# Patient Record
Sex: Female | Born: 1970 | Hispanic: Yes | Marital: Single | Smoking: Never smoker
Health system: Southern US, Community
[De-identification: ages and names within clinical notes are randomized; demographics above are authoritative.]

---

## 2018-01-21 ENCOUNTER — Emergency Department (INDEPENDENT_AMBULATORY_CARE_PROVIDER_SITE_OTHER): Payer: Worker's Compensation

## 2018-01-21 ENCOUNTER — Emergency Department (INDEPENDENT_AMBULATORY_CARE_PROVIDER_SITE_OTHER)
Admission: EM | Admit: 2018-01-21 | Discharge: 2018-01-22 | Disposition: A | Discharge status: Home / Self Care | Payer: Worker's Compensation | Source: Home / Self Care | Attending: Emergency Medicine | Admitting: Emergency Medicine

## 2018-01-21 ENCOUNTER — Other Ambulatory Visit: Payer: Self-pay

## 2018-01-21 DIAGNOSIS — S99922A Unspecified injury of left foot, initial encounter: Secondary | ICD-10-CM | POA: Diagnosis not present

## 2018-01-21 DIAGNOSIS — W19XXXA Unspecified fall, initial encounter: Secondary | ICD-10-CM | POA: Diagnosis not present

## 2018-01-21 DIAGNOSIS — S82832A Other fracture of upper and lower end of left fibula, initial encounter for closed fracture: Secondary | ICD-10-CM

## 2018-01-21 DIAGNOSIS — M25572 Pain in left ankle and joints of left foot: Secondary | ICD-10-CM

## 2018-01-21 NOTE — ED Triage Notes (Signed)
10 panel urine drug screen per [facility] with [employer].  Amber stated that she does not want a blood alcohol.  Any questions call Amber cell-[phone], work [phone], [employer] main number [phone]

## 2018-01-21 NOTE — Discharge Instructions (Signed)
Take ibuprofen for pain. Wear your cast boot. Use your crutches to walk.  Follow-up at employee health next Monday.

## 2018-01-21 NOTE — ED Triage Notes (Signed)
This is a workers comp injury.  She works for [employer], and was walking from [employer] to the office and foot went off of side walk, and she fell and twisted her left ankle.  This happened around 5:30 pm.  She iced and took ibuprofen.  Ankle and foot swollen.

## 2018-01-21 NOTE — ED Provider Notes (Addendum)
[name]    CSN: [id number] Arrival date & time: 01/21/18  2024     History   Chief Complaint Chief Complaint  Patient presents with  . Foot Pain    HPI Gabriella Henderson is a 47 y.o. female.   HPI Patient was in her usual state of health until approximately 5:00 today when she was outside and walking indoors and stepped off of the curve suffering an inversion injury to the left ankle.  She does have a previous history of ligament injuries to the ankle many years ago.  She does not wear a brace.  She felt a pop in the left ankle and after that has not been able to bear weight. History reviewed. No pertinent past medical history.  There are no active problems to display for this patient.   History reviewed. No pertinent surgical history.  OB History   None      Home Medications    Prior to Admission medications   Not on File    Family History History reviewed. No pertinent family history.  Social History Social History   Tobacco Use  . Smoking status: Never Smoker  . Smokeless tobacco: Never Used  Substance Use Topics  . Alcohol use: Not Currently  . Drug use: Not Currently     Allergies   Patient has no known allergies.   Review of Systems Review of Systems  Musculoskeletal:       Patient having pain in her left ankle.  Unable to bear weight.  She is not having any discomfort in her hip or knee and did not fall to the ground.     Physical Exam Triage Vital Signs ED Triage Vitals  Enc Vitals Group     BP 01/21/18 2055 120/82     Pulse Rate 01/21/18 2055 78     Resp --      Temp 01/21/18 2055 97.9 F (36.6 C)     Temp Source 01/21/18 2055 Oral     SpO2 01/21/18 2055 99 %     Weight 01/21/18 2056 148 lb (67.1 kg)     Height 01/21/18 2056  (1.676 m)     Head Circumference --      Peak Flow --      Pain Score 01/21/18 2056 7     Pain Loc --      Pain Edu? --      Excl. in GC? --    No data found.  Updated Vital  Signs BP 120/82 (BP Location: Right Arm)   Pulse 78   Temp 97.9 F (36.6 C) (Oral)   Ht  (1.676 m)   Wt 148 lb (67.1 kg)   LMP 12/30/2017   SpO2 99%   BMI 23.89 kg/m   Visual Acuity Right Eye Distance:   Left Eye Distance:   Bilateral Distance:    Right Eye Near:   Left Eye Near:    Bilateral Near:     Physical Exam  Musculoskeletal:  There is significant swelling over the distal fibula.  There is pain with inversion and eversion.  There is a negative Homans sign.  There is normal capillary refill and dorsalis pedis pulses 2+.  There is no numbness to the toes.     UC Treatments / Results  Labs (all labs ordered are listed, but only abnormal results are displayed) Labs Reviewed - No data to display  EKG None  Radiology Dg Ankle Complete Left  Result Date:  01/21/2018 CLINICAL DATA:  Fall with ankle pain EXAM: LEFT ANKLE COMPLETE - 3+ VIEW COMPARISON:  None. FINDINGS: Possible small avulsion injury off the lateral fibular malleolar tip ankle mortise is symmetric. Minimal soft tissue swelling IMPRESSION: Possible small avulsion off the fibular malleolar tip. Electronically Signed   By: Jasmine Pang M.D.   On: 01/21/2018 21:15   Dg Foot Complete Left  Result Date: 01/21/2018 CLINICAL DATA:  Fall with injury to the foot and ankle EXAM: LEFT FOOT - COMPLETE 3+ VIEW COMPARISON:  None. FINDINGS: No fracture or malalignment.  Joint spaces are maintained. IMPRESSION: No acute osseous abnormality Electronically Signed   By: Jasmine Pang M.D.   On: 01/21/2018 21:14    Procedures Procedures (including critical care time)  Medications Ordered in UC Medications - No data to display  Initial Impression / Assessment and Plan / UC Course  I have reviewed the triage vital signs and the nursing notes.  Pertinent labs & imaging results that were available during my care of the patient were reviewed by me and considered in my medical decision making (see chart for details).       There appears to be a small a avulsion to the distal fibula.  She will be placed in a cast boot and given crutches.  She will be taken out of work and was advised to be seen at employee health on Monday. Final Clinical Impressions(s) / UC Diagnoses   Final diagnoses:  Other closed fracture of distal end of left fibula, initial encounter     Discharge Instructions     Take ibuprofen for pain. Wear your cast boot. Use your crutches to walk.  Follow-up at employee health next Monday.    ED Prescriptions    None     Controlled Substance Prescriptions  Controlled Substance Registry consulted? Not Applicable   Collene Gobble, MD 01/21/18 2133    Collene Gobble, MD 01/21/18 2153

## 2018-08-30 IMAGING — DX DG ANKLE COMPLETE 3+V*L*
3 series · 3 of 3 positions shown · non-contrast
Comparison: None.

CLINICAL DATA: Fall with ankle pain

EXAM:
LEFT ANKLE COMPLETE - 3+ VIEW

[ankle ap]
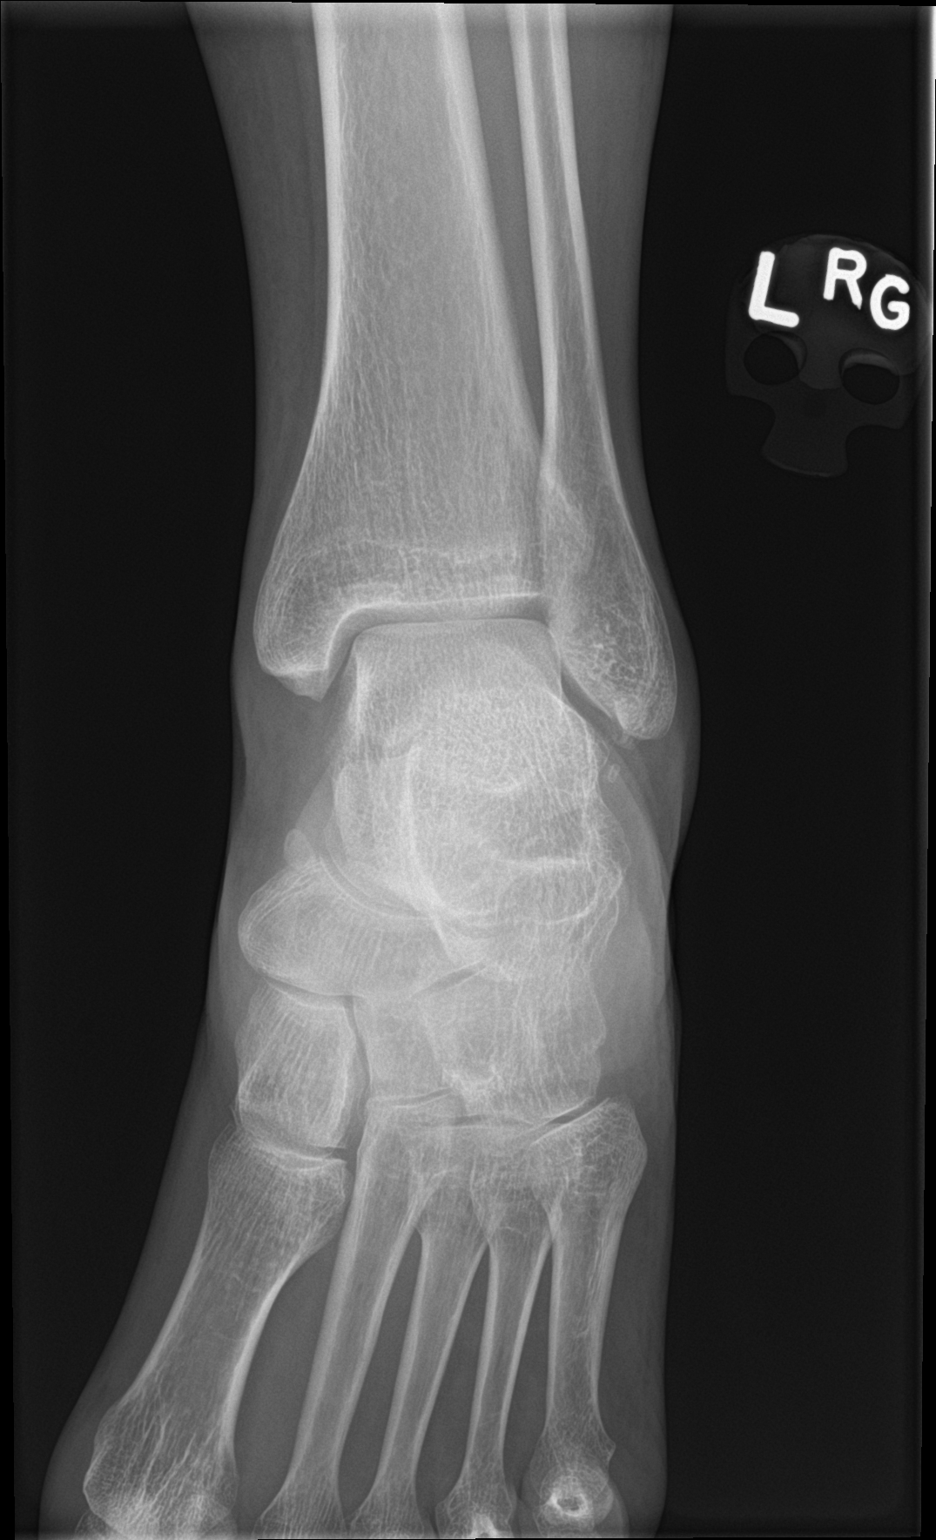

[ankle obl]
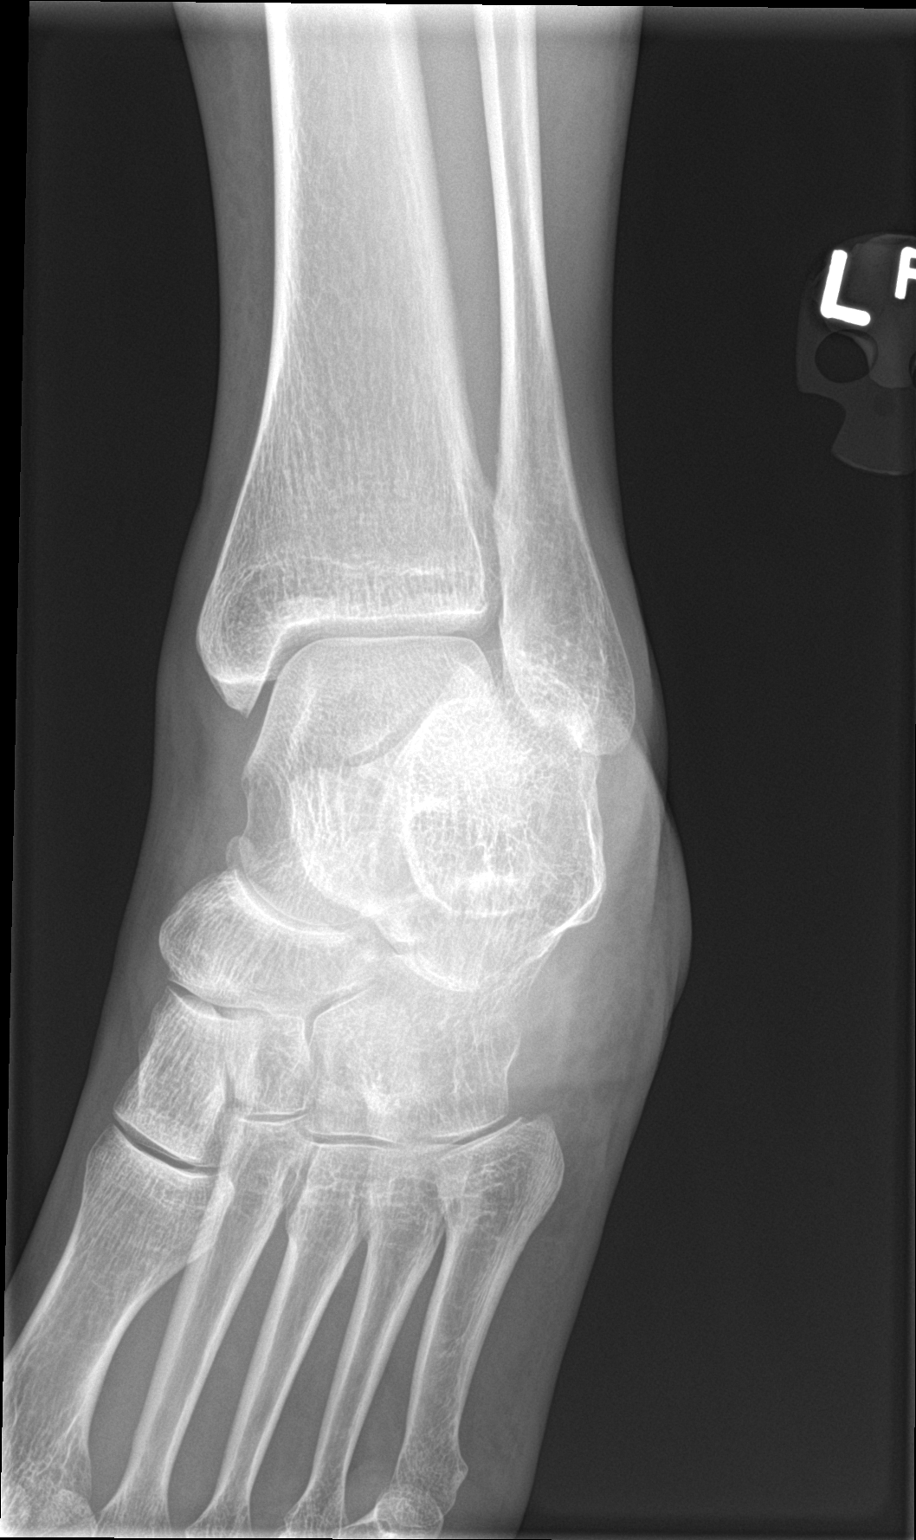

[ankle lat]
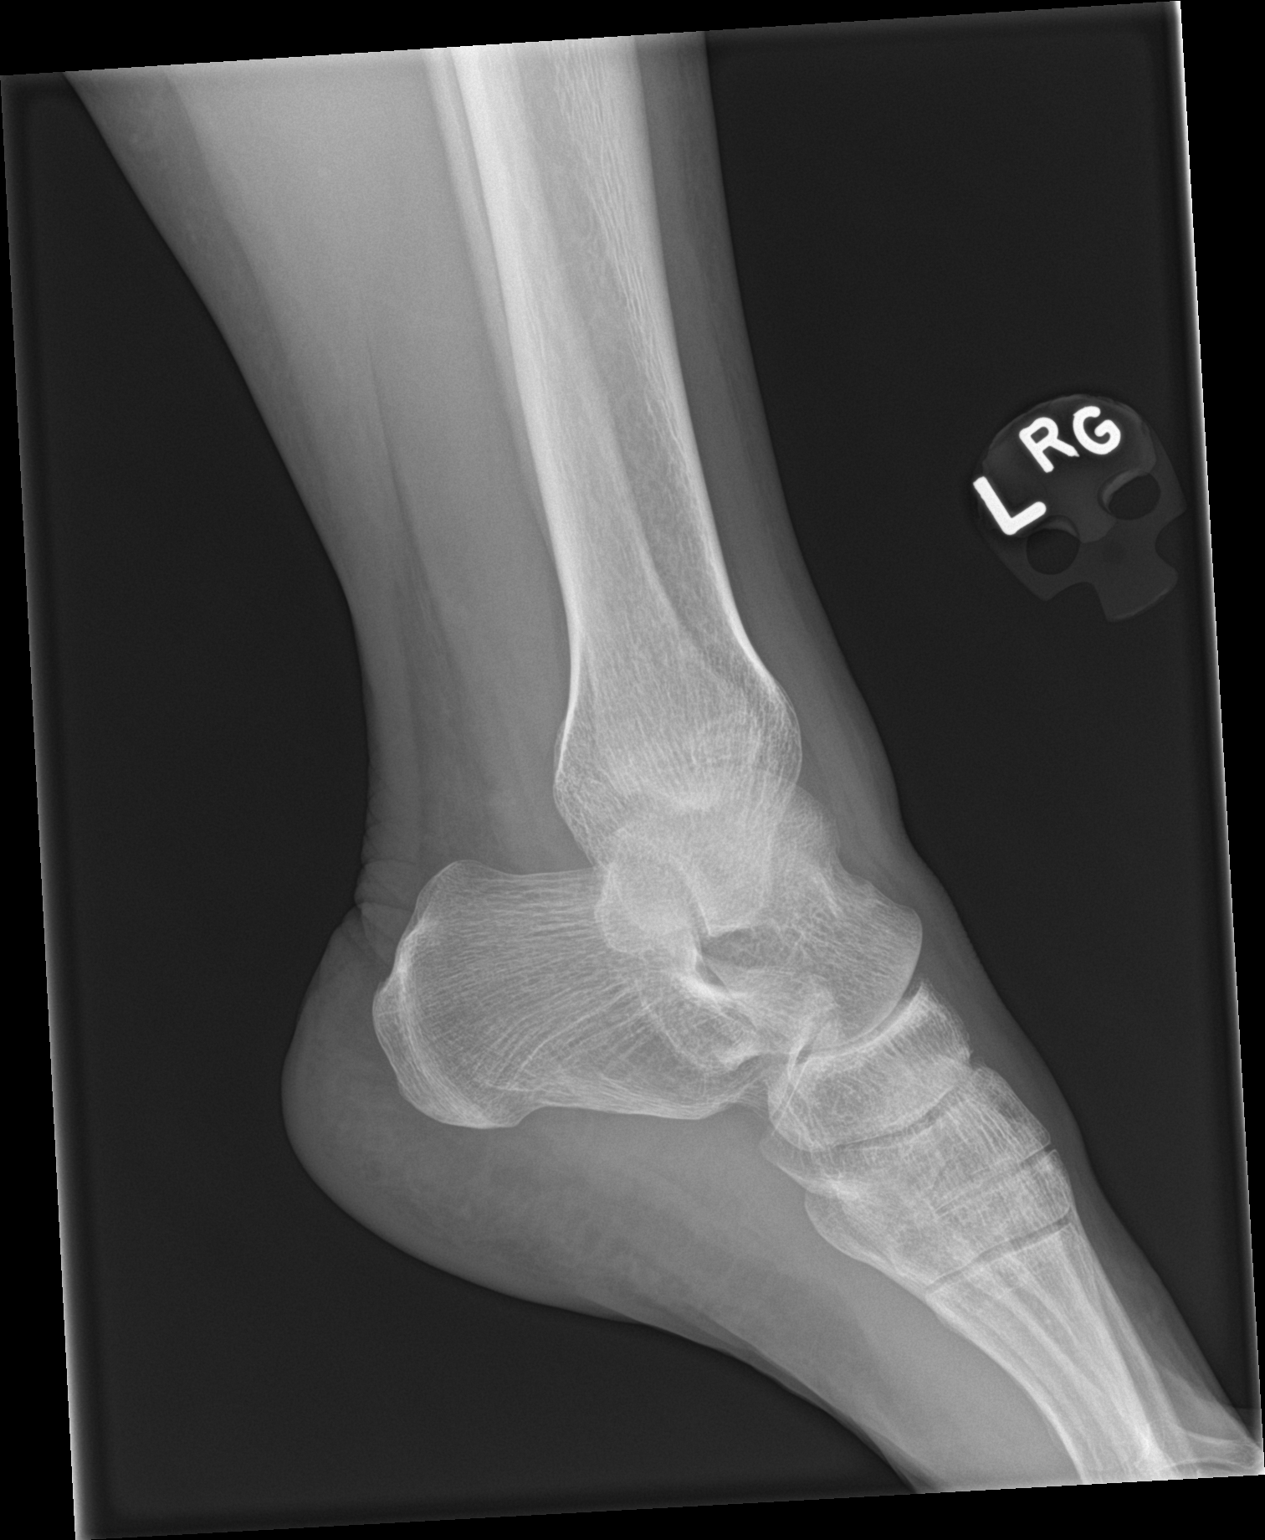

[3 of 3 positions shown; findings below may reference images not displayed]

FINDINGS: Possible small avulsion injury off the lateral fibular malleolar tip
ankle mortise is symmetric. Minimal soft tissue swelling
IMPRESSION: Possible small avulsion off the fibular malleolar tip.
